# Patient Record
Sex: Male | Born: 1955 | Race: Asian | Hispanic: No | Marital: Married | Smoking: Never smoker
Health system: Southern US, Community
[De-identification: ages and names within clinical notes are randomized; demographics above are authoritative.]

## PROBLEM LIST (undated history)

## (undated) DIAGNOSIS — B1920 Unspecified viral hepatitis C without hepatic coma: Secondary | ICD-10-CM

## (undated) DIAGNOSIS — I10 Essential (primary) hypertension: Secondary | ICD-10-CM

## (undated) DIAGNOSIS — E785 Hyperlipidemia, unspecified: Secondary | ICD-10-CM

## (undated) DIAGNOSIS — E119 Type 2 diabetes mellitus without complications: Secondary | ICD-10-CM

## (undated) HISTORY — PX: COLONOSCOPY WITH PROPOFOL: SHX5780

## (undated) HISTORY — PX: OTHER SURGICAL HISTORY: SHX169

---

## 2010-03-31 ENCOUNTER — Emergency Department: Payer: Self-pay | Admitting: Emergency Medicine

## 2010-11-24 ENCOUNTER — Emergency Department: Payer: Self-pay | Admitting: Emergency Medicine

## 2011-10-18 IMAGING — CR RIGHT HAND - COMPLETE 3+ VIEW
1 series · 3 of 3 positions shown · non-contrast
Comparison: none

REASON FOR EXAM: pain after injury  -  ed waiting room
COMMENTS:   May transport without cardiac monitor

PROCEDURE:     DXR - DXR HAND RT COMPLETE W/OBLIQUES  - March 31, 2010 [DATE]
RESULT:     Images of the right hand demonstrate radiocarpal degenerative
change. There is no evidence of fracture, dislocation or radiopaque foreign
body.

[Series 1: view not recorded · 0.17mm/px · 3 of 3 slices shown]
[im 1/3]
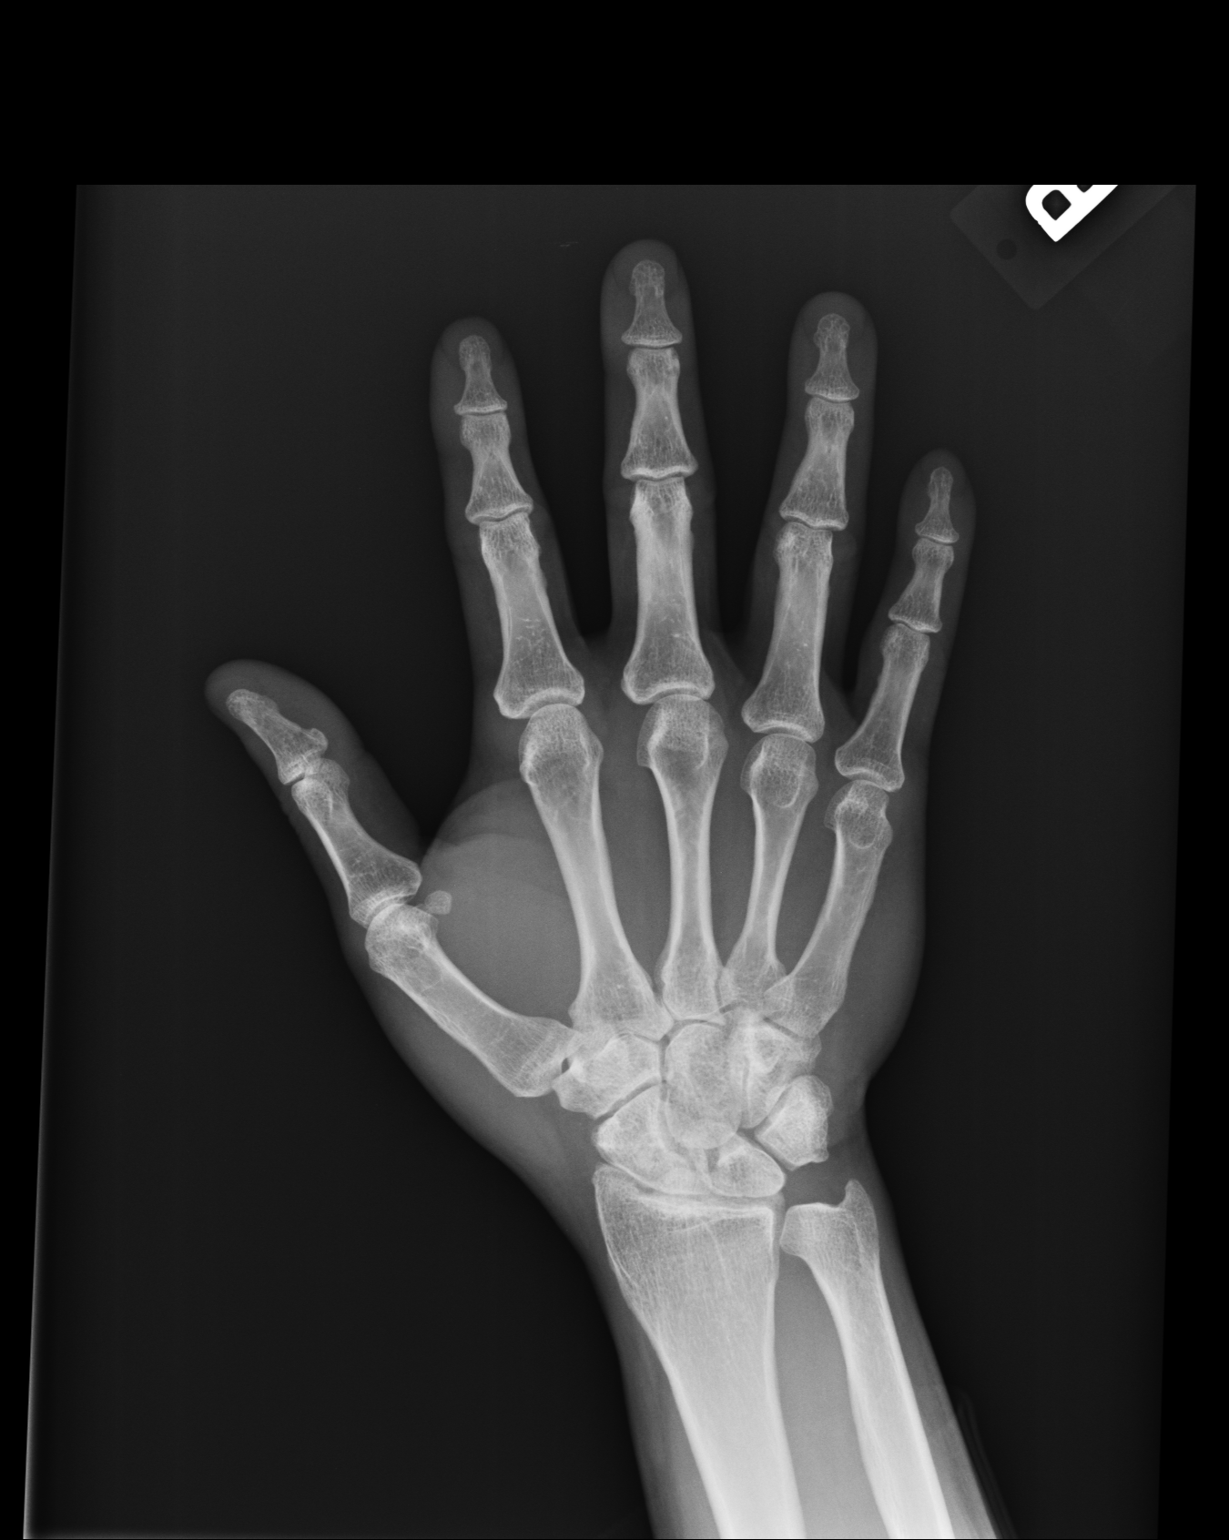
[im 2/3]
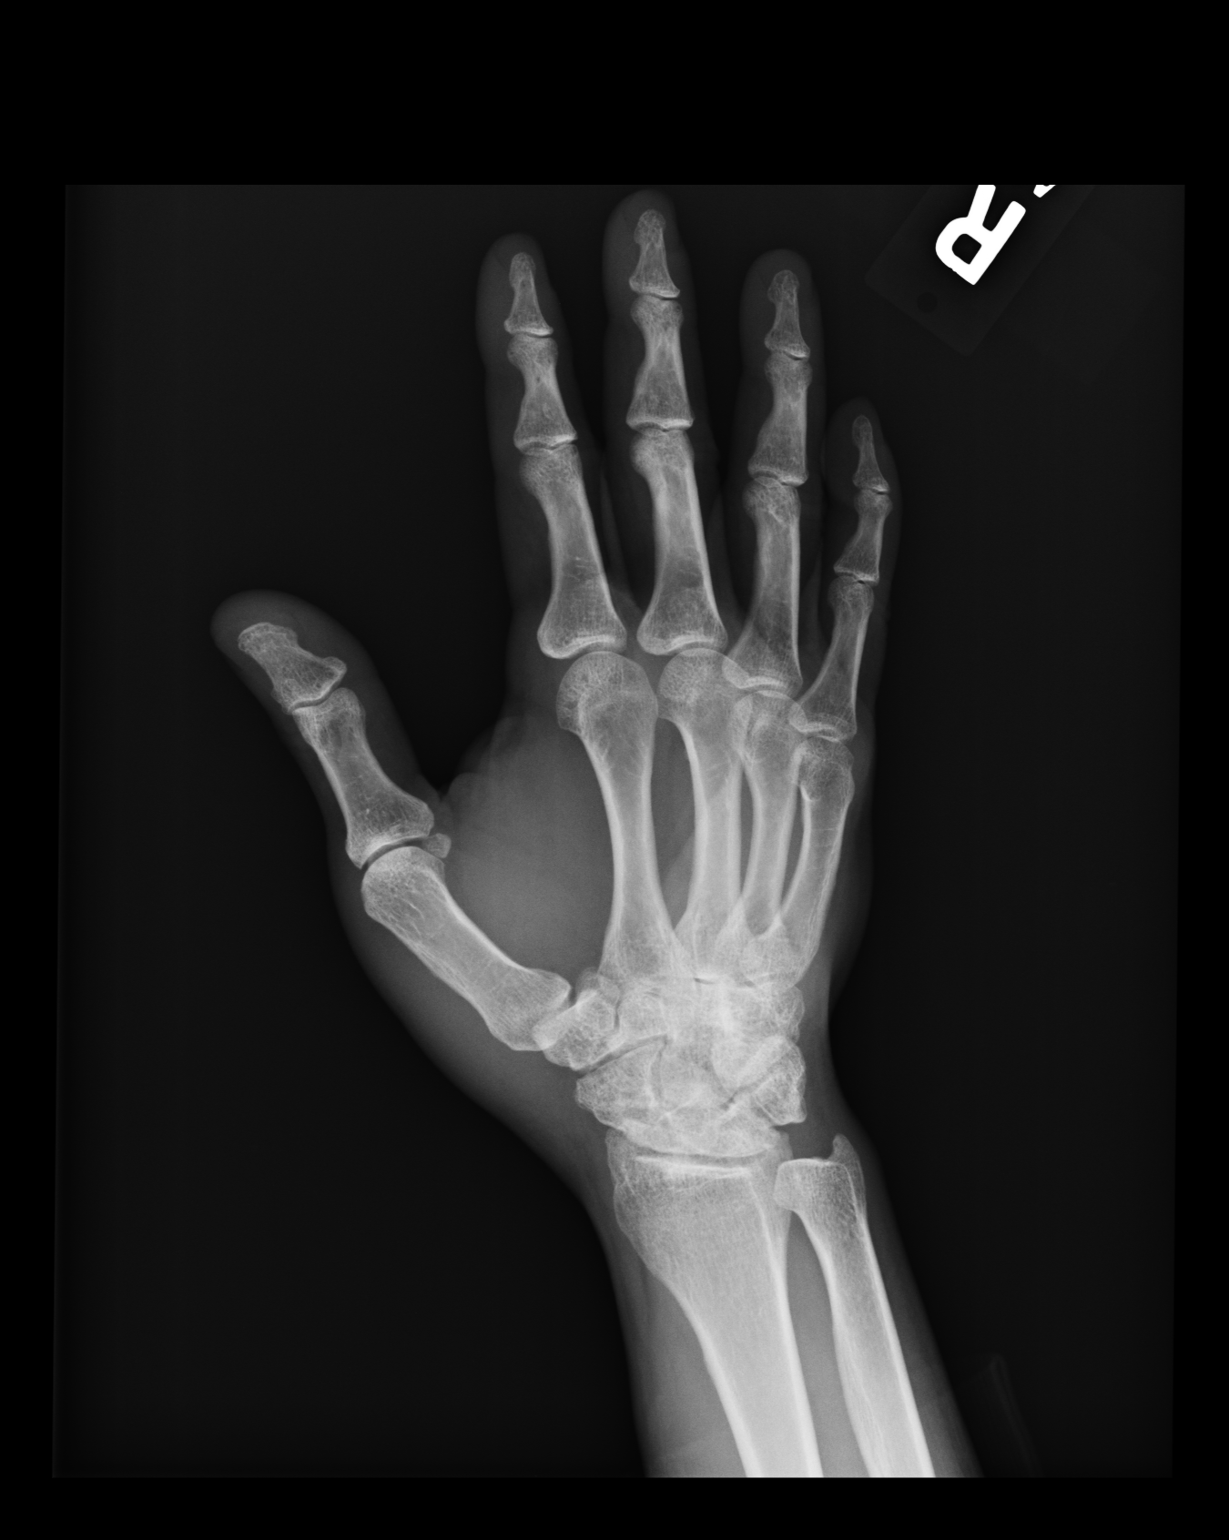
[im 3/3]
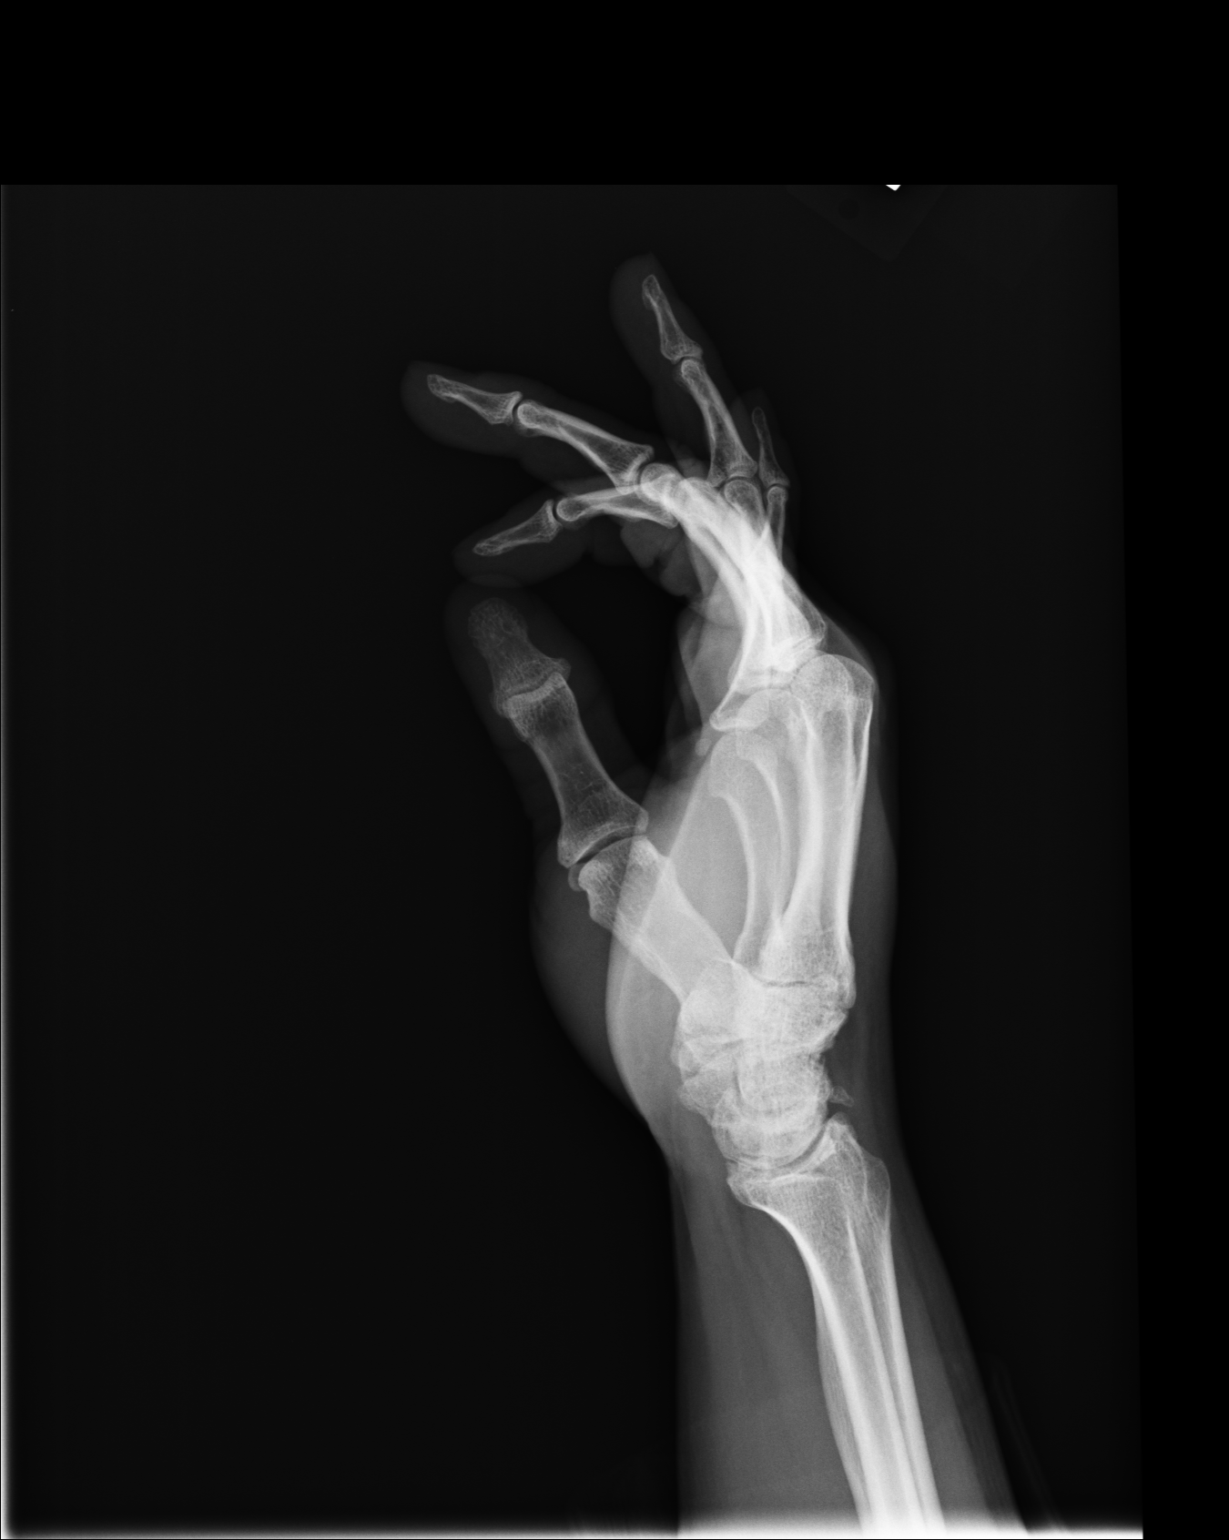

[3 of 3 positions shown; findings below may reference images not displayed]

IMPRESSION: No acute bony abnormality.

## 2021-04-16 ENCOUNTER — Encounter: Payer: Self-pay | Admitting: Emergency Medicine

## 2021-04-16 ENCOUNTER — Emergency Department: Payer: Medicare Other

## 2021-04-16 DIAGNOSIS — Z5321 Procedure and treatment not carried out due to patient leaving prior to being seen by health care provider: Secondary | ICD-10-CM | POA: Diagnosis not present

## 2021-04-16 DIAGNOSIS — R079 Chest pain, unspecified: Secondary | ICD-10-CM | POA: Insufficient documentation

## 2021-04-16 LAB — CBC
HCT: 46.6 % (ref 39.0–52.0)
Hemoglobin: 16.3 g/dL (ref 13.0–17.0)
MCH: 31.7 pg (ref 26.0–34.0)
MCHC: 35 g/dL (ref 30.0–36.0)
MCV: 90.5 fL (ref 80.0–100.0)
Platelets: 197 10*3/uL (ref 150–400)
RBC: 5.15 MIL/uL (ref 4.22–5.81)
RDW: 13 % (ref 11.5–15.5)
WBC: 5.5 10*3/uL (ref 4.0–10.5)
nRBC: 0 % (ref 0.0–0.2)

## 2021-04-16 LAB — BASIC METABOLIC PANEL
Anion gap: 7 (ref 5–15)
BUN: 21 mg/dL (ref 8–23)
CO2: 25 mmol/L (ref 22–32)
Calcium: 9 mg/dL (ref 8.9–10.3)
Chloride: 104 mmol/L (ref 98–111)
Creatinine, Ser: 1.02 mg/dL (ref 0.61–1.24)
GFR, Estimated: >60 mL/min (ref >60)
Glucose, Bld: 142 mg/dL — ABNORMAL HIGH (ref 70–99)
Potassium: 3.7 mmol/L (ref 3.5–5.1)
Sodium: 136 mmol/L (ref 135–145)

## 2021-04-16 LAB — TROPONIN I (HIGH SENSITIVITY): Troponin I (High Sensitivity): 9 ng/L (ref <18)

## 2021-04-16 NOTE — ED Triage Notes (Signed)
Pt in with sharp L cp, onset 2 days ago. Pt states he felt the pain suddenly while sitting in his office chair at work, intermittent ever since. Also reporting higher than normal BP's (200's/100's PTA). Takes HTN meds, and some have been changed recently in past 10 days. Denies any sob, having some nausea. EKG reading afib in triage (HR 60-70's), endorses some dizziness

## 2021-04-17 ENCOUNTER — Emergency Department
Admission: EM | Admit: 2021-04-17 | Discharge: 2021-04-17 | Disposition: A | Discharge status: Home / Self Care | Payer: Medicare Other | Attending: Emergency Medicine | Admitting: Emergency Medicine

## 2021-04-17 DIAGNOSIS — R079 Chest pain, unspecified: Secondary | ICD-10-CM | POA: Diagnosis not present

## 2021-04-17 HISTORY — DX: Type 2 diabetes mellitus without complications: E11.9

## 2021-04-17 HISTORY — DX: Unspecified viral hepatitis C without hepatic coma: B19.20

## 2021-04-17 HISTORY — DX: Hyperlipidemia, unspecified: E78.5

## 2021-04-17 HISTORY — DX: Essential (primary) hypertension: I10

## 2021-04-17 LAB — TROPONIN I (HIGH SENSITIVITY): Troponin I (High Sensitivity): 8 ng/L (ref <18)

## 2021-04-17 NOTE — ED Notes (Signed)
No answer when called several times from lobby 

## 2022-04-23 NOTE — Anesthesia Preprocedure Evaluation (Signed)
Anesthesia Evaluation  Patient identified by MRN, date of birth, ID band Patient awake    Reviewed: Allergy & Precautions, NPO status , Patient's Chart, lab work & pertinent test results  Airway Mallampati: III  TM Distance: >3 FB Neck ROM: full    Dental  (+) Chipped   Pulmonary neg pulmonary ROS   Pulmonary exam normal        Cardiovascular Exercise Tolerance: Good hypertension, + dysrhythmias Atrial Fibrillation   Atrial fibrillation, S/P ablation with afib noted in clinic   Neuro/Psych negative neurological ROS  negative psych ROS   GI/Hepatic negative GI ROS, Neg liver ROS,,,  Endo/Other  diabetes    Renal/GU negative Renal ROS  negative genitourinary   Musculoskeletal   Abdominal   Peds  Hematology negative hematology ROS (+)   Anesthesia Other Findings Past Medical History: No date: Benign essential HTN No date: Diabetes mellitus without complication (HCC) No date: Hepatitis C No date: Hyperlipemia  No past surgical history on file.     Reproductive/Obstetrics negative OB ROS                             Anesthesia Physical Anesthesia Plan  ASA: 3  Anesthesia Plan: General   Post-op Pain Management:    Induction:   PONV Risk Score and Plan: Propofol infusion and TIVA  Airway Management Planned:   Additional Equipment:   Intra-op Plan:   Post-operative Plan:   Informed Consent:      Dental Advisory Given  Plan Discussed with: Anesthesiologist, CRNA and Surgeon  Anesthesia Plan Comments:         Anesthesia Quick Evaluation

## 2022-04-26 ENCOUNTER — Ambulatory Visit: Payer: Medicare Other | Admitting: Anesthesiology

## 2022-04-26 ENCOUNTER — Encounter
Admission: RE | Disposition: A | Discharge status: Home / Self Care | Payer: Self-pay | Source: Ambulatory Visit | Attending: Internal Medicine

## 2022-04-26 ENCOUNTER — Ambulatory Visit
Admission: RE | Admit: 2022-04-26 | Discharge: 2022-04-26 | Disposition: A | Discharge status: Home / Self Care | Payer: Medicare Other | Source: Ambulatory Visit | Attending: Internal Medicine | Admitting: Internal Medicine

## 2022-04-26 ENCOUNTER — Encounter: Payer: Self-pay | Admitting: Internal Medicine

## 2022-04-26 DIAGNOSIS — I4819 Other persistent atrial fibrillation: Secondary | ICD-10-CM

## 2022-04-26 DIAGNOSIS — E785 Hyperlipidemia, unspecified: Secondary | ICD-10-CM | POA: Diagnosis not present

## 2022-04-26 DIAGNOSIS — B1920 Unspecified viral hepatitis C without hepatic coma: Secondary | ICD-10-CM | POA: Diagnosis not present

## 2022-04-26 DIAGNOSIS — I10 Essential (primary) hypertension: Secondary | ICD-10-CM | POA: Insufficient documentation

## 2022-04-26 DIAGNOSIS — E119 Type 2 diabetes mellitus without complications: Secondary | ICD-10-CM | POA: Diagnosis not present

## 2022-04-26 DIAGNOSIS — I4891 Unspecified atrial fibrillation: Secondary | ICD-10-CM | POA: Insufficient documentation

## 2022-04-26 DIAGNOSIS — I252 Old myocardial infarction: Secondary | ICD-10-CM | POA: Diagnosis not present

## 2022-04-26 HISTORY — PX: CARDIOVERSION: SHX1299

## 2022-04-26 LAB — GLUCOSE, CAPILLARY: Glucose-Capillary: 140 mg/dL — ABNORMAL HIGH (ref 70–99)

## 2022-04-26 SURGERY — CARDIOVERSION
Anesthesia: General

## 2022-04-26 MED ORDER — PROPOFOL 10 MG/ML IV BOLUS
INTRAVENOUS | Status: DC | PRN
  Administered 2022-04-26: 70 mg via INTRAVENOUS

## 2022-04-26 MED ORDER — SODIUM CHLORIDE 0.9 % IV SOLN: INTRAVENOUS | Status: DC

## 2022-04-26 NOTE — CV Procedure (Addendum)
Electrical Cardioversion Procedure Note   Procedure: Electrical Cardioversion Indications:  Atrial Fibrillation  Procedure Details Consent: Risks of procedure as well as the alternatives and risks of each were explained to the (patient/caregiver).  Consent for procedure obtained. Time Out: Verified patient identification, verified procedure, site/side was marked, verified correct patient position, special equipment/implants available, medications/allergies/relevent history reviewed, required imaging and test results available.  Performed  Patient placed on cardiac monitor, pulse oximetry, supplemental oxygen as necessary.  Sedation given:    Yes , anesthesia Pacer pads placed anterior and posterior chest.  Cardioverted 1 time(s).  Cardioverted at 120J.  Evaluation Findings: Post procedure EKG shows: NSR 70 nsstw Complications: None Patient did tolerate procedure well.   Dorothyann Peng MD 04/26/22 7:45am

## 2022-04-26 NOTE — Transfer of Care (Signed)
Immediate Anesthesia Transfer of Care Note  Patient: Troy Chase  Procedure(s) Performed: CARDIOVERSION  Patient Location: Short Stay  Anesthesia Type:General  Level of Consciousness: drowsy  Airway & Oxygen Therapy: Patient Spontanous Breathing and Patient connected to nasal cannula oxygen  Post-op Assessment: Report given to RN, Post -op Vital signs reviewed and stable, and Patient moving all extremities  Post vital signs: Reviewed and stable  Last Vitals:  Vitals Value Taken Time  BP 122/92 04/26/22 0742  Temp    Pulse 61 04/26/22 0745  Resp 26 04/26/22 0745  SpO2 92 % 04/26/22 0745  Vitals shown include unvalidated device data.  Last Pain:  Vitals:   04/26/22 0715  TempSrc: Oral  PainSc: 0-No pain         Complications: No notable events documented.

## 2022-04-26 NOTE — Anesthesia Postprocedure Evaluation (Signed)
Anesthesia Post Note  Patient: Troy Chase  Procedure(s) Performed: CARDIOVERSION  Patient location during evaluation: PACU Anesthesia Type: General Level of consciousness: awake and alert Pain management: pain level controlled Vital Signs Assessment: post-procedure vital signs reviewed and stable Respiratory status: spontaneous breathing, nonlabored ventilation and respiratory function stable Cardiovascular status: blood pressure returned to baseline and stable Postop Assessment: no apparent nausea or vomiting Anesthetic complications: no   No notable events documented.   Last Vitals:  Vitals:   04/26/22 0750 04/26/22 0800  BP: 127/75 (!) 124/91  Pulse: 64 67  Resp: 19 17  Temp:    SpO2: 97% 96%    Last Pain:  Vitals:   04/26/22 0800  TempSrc:   PainSc: 0-No pain                 Foye Deer

## 2022-09-02 ENCOUNTER — Encounter: Payer: Self-pay | Admitting: *Deleted

## 2022-09-03 ENCOUNTER — Ambulatory Visit
Admission: RE | Admit: 2022-09-03 | Discharge: 2022-09-03 | Disposition: A | Discharge status: Home / Self Care | Payer: Medicare Other | Attending: Gastroenterology | Admitting: Gastroenterology

## 2022-09-03 ENCOUNTER — Encounter
Admission: RE | Disposition: A | Discharge status: Home / Self Care | Payer: Self-pay | Source: Home / Self Care | Attending: Gastroenterology

## 2022-09-03 ENCOUNTER — Ambulatory Visit: Payer: Medicare Other | Admitting: Anesthesiology

## 2022-09-03 ENCOUNTER — Encounter: Payer: Self-pay | Admitting: *Deleted

## 2022-09-03 DIAGNOSIS — K640 First degree hemorrhoids: Secondary | ICD-10-CM | POA: Insufficient documentation

## 2022-09-03 DIAGNOSIS — I4891 Unspecified atrial fibrillation: Secondary | ICD-10-CM | POA: Insufficient documentation

## 2022-09-03 DIAGNOSIS — Z7984 Long term (current) use of oral hypoglycemic drugs: Secondary | ICD-10-CM | POA: Insufficient documentation

## 2022-09-03 DIAGNOSIS — I440 Atrioventricular block, first degree: Secondary | ICD-10-CM | POA: Diagnosis not present

## 2022-09-03 DIAGNOSIS — Z8619 Personal history of other infectious and parasitic diseases: Secondary | ICD-10-CM | POA: Insufficient documentation

## 2022-09-03 DIAGNOSIS — E119 Type 2 diabetes mellitus without complications: Secondary | ICD-10-CM | POA: Diagnosis not present

## 2022-09-03 DIAGNOSIS — D123 Benign neoplasm of transverse colon: Secondary | ICD-10-CM | POA: Diagnosis not present

## 2022-09-03 DIAGNOSIS — Z7901 Long term (current) use of anticoagulants: Secondary | ICD-10-CM | POA: Diagnosis not present

## 2022-09-03 DIAGNOSIS — Z1211 Encounter for screening for malignant neoplasm of colon: Secondary | ICD-10-CM | POA: Insufficient documentation

## 2022-09-03 DIAGNOSIS — Z86010 Personal history of colonic polyps: Secondary | ICD-10-CM | POA: Diagnosis present

## 2022-09-03 DIAGNOSIS — I10 Essential (primary) hypertension: Secondary | ICD-10-CM | POA: Diagnosis not present

## 2022-09-03 HISTORY — PX: COLONOSCOPY WITH PROPOFOL: SHX5780

## 2022-09-03 LAB — GLUCOSE, CAPILLARY: Glucose-Capillary: 116 mg/dL — ABNORMAL HIGH (ref 70–99)

## 2022-09-03 SURGERY — COLONOSCOPY WITH PROPOFOL
Anesthesia: General

## 2022-09-03 MED ORDER — SODIUM CHLORIDE 0.9 % IV SOLN: INTRAVENOUS | Status: DC

## 2022-09-03 MED ORDER — LIDOCAINE HCL (CARDIAC) PF 100 MG/5ML IV SOSY
PREFILLED_SYRINGE | INTRAVENOUS | Status: DC | PRN
  Administered 2022-09-03: 50 mg via INTRAVENOUS

## 2022-09-03 MED ORDER — LIDOCAINE HCL (PF) 2 % IJ SOLN
INTRAMUSCULAR | Status: AC
  Filled 2022-09-03: qty 5

## 2022-09-03 MED ORDER — PROPOFOL 1000 MG/100ML IV EMUL
INTRAVENOUS | Status: AC
  Filled 2022-09-03: qty 100

## 2022-09-03 MED ORDER — PROPOFOL 10 MG/ML IV BOLUS
INTRAVENOUS | Status: DC | PRN
  Administered 2022-09-03 (×4): 20 mg via INTRAVENOUS
  Administered 2022-09-03: 30 mg via INTRAVENOUS
  Administered 2022-09-03: 70 mg via INTRAVENOUS
  Administered 2022-09-03: 30 mg via INTRAVENOUS
  Administered 2022-09-03: 20 mg via INTRAVENOUS

## 2022-09-03 MED ORDER — STERILE WATER FOR IRRIGATION IR SOLN
Status: DC | PRN
  Administered 2022-09-03: 120 mL

## 2022-09-03 NOTE — Op Note (Signed)
[facility] Gastroenterology Patient Name: Troy Chase Procedure Date: 09/03/2022 11:24 AM MRN: [id number] Account #: [id number] Date of Birth: 1955-09-15 Admit Type: Outpatient Age: 67 Room: [facility] ENDO ROOM 3 Gender: Male Note Status: Finalized Instrument Name: Peds Colonoscope [id number] Procedure:             Colonoscopy Indications:           Surveillance: Personal history of adenomatous polyps                         on last colonoscopy > 5 years ago Providers:             Eather Colas MD, MD Medicines:             Monitored Anesthesia Care Complications:         No immediate complications. Estimated blood loss:                         Minimal. Procedure:             Pre-Anesthesia Assessment:                        - Prior to the procedure, a History and Physical was                         performed, and patient medications and allergies were                         reviewed. The patient is competent. The risks and                         benefits of the procedure and the sedation options and                         risks were discussed with the patient. All questions                         were answered and informed consent was obtained.                         Patient identification and proposed procedure were                         verified by the physician, the nurse, the                         anesthesiologist, the anesthetist and the technician                         in the endoscopy suite. Mental Status Examination:                         alert and oriented. Airway Examination: normal                         oropharyngeal airway and neck mobility. Respiratory                         Examination: clear to auscultation. CV Examination:  normal. Prophylactic Antibiotics: The patient does not                         require prophylactic antibiotics. Prior                         Anticoagulants: The patient has taken Eliquis                          (apixaban), last dose was 3 days prior to procedure.                         ASA Grade Assessment: III - A patient with severe                         systemic disease. After reviewing the risks and                         benefits, the patient was deemed in satisfactory                         condition to undergo the procedure. The anesthesia                         plan was to use monitored anesthesia care (MAC).                         Immediately prior to administration of medications,                         the patient was re-assessed for adequacy to receive                         sedatives. The heart rate, respiratory rate, oxygen                         saturations, blood pressure, adequacy of pulmonary                         ventilation, and response to care were monitored                         throughout the procedure. The physical status of the                         patient was re-assessed after the procedure.                        After obtaining informed consent, the colonoscope was                         passed under direct vision. Throughout the procedure,                         the patient's blood pressure, pulse, and oxygen                         saturations were monitored continuously. The  Colonoscope was introduced through the anus and                         advanced to the the cecum, identified by appendiceal                         orifice and ileocecal valve. The colonoscopy was                         somewhat difficult due to a tortuous colon. The                         patient tolerated the procedure well. The quality of                         the bowel preparation was adequate to identify polyps.                         The ileocecal valve, appendiceal orifice, and rectum                         were photographed. Findings:      The perianal and digital rectal examinations were normal.      A 1 mm  polyp was found in the transverse colon. The polyp was       hyperplastic. The polyp was removed with a jumbo cold forceps. Resection       and retrieval were complete. Estimated blood loss was minimal.      Internal hemorrhoids were found during retroflexion. The hemorrhoids       were Grade I (internal hemorrhoids that do not prolapse).      The exam was otherwise without abnormality on direct and retroflexion       views. Impression:            - One 1 mm polyp in the transverse colon, removed with                         a jumbo cold forceps. Resected and retrieved.                        - Internal hemorrhoids.                        - The examination was otherwise normal on direct and                         retroflexion views. Recommendation:        - Discharge patient to home.                        - Resume previous diet.                        - Resume Eliquis (apixaban) at prior dose today.                        - Await pathology results.                        - Repeat colonoscopy  in 5 years for surveillance.                        - Return to referring physician as previously                         scheduled. Procedure Code(s):     --- Professional ---                        [phone], Colonoscopy, flexible; with biopsy, single or                         multiple Diagnosis Code(s):     --- Professional ---                        Z86.010, Personal history of colonic polyps                        D12.3, Benign neoplasm of transverse colon (hepatic                         flexure or splenic flexure)                        K64.0, First degree hemorrhoids CPT copyright 2022 [employer]. All rights reserved. The codes documented in this report are preliminary and upon coder review may  be revised to meet current compliance requirements. Eather Colas MD, MD 09/03/2022 12:02:17 PM Number of Addenda: 0 Note Initiated On: 09/03/2022 11:24 AM Scope Withdrawal  Time: 0 hours 12 minutes 26 seconds  Total Procedure Duration: 0 hours 18 minutes 38 seconds  Estimated Blood Loss:  Estimated blood loss was minimal.      [facility]

## 2022-09-03 NOTE — Anesthesia Postprocedure Evaluation (Signed)
Anesthesia Post Note  Patient: Troy Chase  Procedure(s) Performed: COLONOSCOPY WITH PROPOFOL  Patient location during evaluation: PACU Anesthesia Type: General Level of consciousness: awake and alert, oriented and patient cooperative Pain management: pain level controlled Vital Signs Assessment: post-procedure vital signs reviewed and stable Respiratory status: spontaneous breathing, nonlabored ventilation and respiratory function stable Cardiovascular status: blood pressure returned to baseline and stable Postop Assessment: adequate PO intake Anesthetic complications: no   No notable events documented.   Last Vitals:  Vitals:   09/03/22 1211 09/03/22 1221  BP: 130/82 129/77  Pulse:    Resp:    Temp:    SpO2:      Last Pain:  Vitals:   09/03/22 1221  TempSrc:   PainSc: 0-No pain                 Reed Breech

## 2022-09-03 NOTE — H&P (Signed)
Outpatient short stay form Pre-procedure 09/03/2022  Regis Bill, MD  Primary Physician: Danella Penton, MD  Reason for visit:  Surveillance  History of present illness:    67 y/o gentleman with PMH of a. Fib on DOAC (last dose 3 days ago) and hypertension here for surveillance colonoscopy. Last colonoscopy in 2017 with benign polyps. No family history of colon or stomach cancer. No abdominal surgeries.    Current Facility-Administered Medications:    0.9 %  sodium chloride infusion, , Intravenous, Continuous, Nuala Chiles, Rossie Muskrat, MD, Last Rate: 20 mL/hr at 09/03/22 1129, New Bag at 09/03/22 1129  Medications Prior to Admission  Medication Sig Dispense Refill Last Dose   amiodarone (PACERONE) 200 MG tablet Take 200 mg by mouth daily.   08/31/2022   amLODipine (NORVASC) 10 MG tablet Take 10 mg by mouth daily.   08/31/2022   apixaban (ELIQUIS) 5 MG TABS tablet Take 5 mg by mouth 2 (two) times daily.   08/31/2022   atorvastatin (LIPITOR) 10 MG tablet Take 10 mg by mouth daily.      clonazePAM (KLONOPIN) 0.5 MG tablet Take 0.5 mg by mouth 2 (two) times daily as needed for anxiety.   08/31/2022   metformin (FORTAMET) 500 MG (OSM) 24 hr tablet Take 500 mg by mouth daily with breakfast.   08/31/2022   telmisartan (MICARDIS) 80 MG tablet Take 80 mg by mouth daily.   08/31/2022     No Known Allergies   Past Medical History:  Diagnosis Date   Benign essential HTN    Diabetes mellitus without complication    Hepatitis C    Hyperlipemia     Review of systems:  Otherwise negative.    Physical Exam  Gen: Alert, oriented. Appears stated age.  HEENT: PERRLA. Lungs: No respiratory distress CV: RRR Abd: soft, benign, no masses Ext: No edema    Planned procedures: Proceed with colonoscopy. The patient understands the nature of the planned procedure, indications, risks, alternatives and potential complications including but not limited to bleeding, infection, perforation, damage to  internal organs and possible oversedation/side effects from anesthesia. The patient agrees and gives consent to proceed.  Please refer to procedure notes for findings, recommendations and patient disposition/instructions.     Regis Bill, MD [facility] Gastroenterology

## 2022-09-03 NOTE — Interval H&P Note (Signed)
History and Physical Interval Note:  09/03/2022 11:35 AM  Troy Chase  has presented today for surgery, with the diagnosis of PH Colon Polyps.  The various methods of treatment have been discussed with the patient and family. After consideration of risks, benefits and other options for treatment, the patient has consented to  Procedure(s): COLONOSCOPY WITH PROPOFOL (N/A) as a surgical intervention.  The patient's history has been reviewed, patient examined, no change in status, stable for surgery.  I have reviewed the patient's chart and labs.  Questions were answered to the patient's satisfaction.     Regis Bill  Ok to proceed with colonoscopy

## 2022-09-03 NOTE — Anesthesia Preprocedure Evaluation (Addendum)
Anesthesia Evaluation  Patient identified by MRN, date of birth, ID band Patient awake    Reviewed: Allergy & Precautions, NPO status , Patient's Chart, lab work & pertinent test results  History of Anesthesia Complications Negative for: history of anesthetic complications  Airway Mallampati: IV   Neck ROM: Full    Dental no notable dental hx.    Pulmonary neg pulmonary ROS   Pulmonary exam normal breath sounds clear to auscultation       Cardiovascular hypertension, Normal cardiovascular exam+ dysrhythmias (a fib on Eliquis)  Rhythm:Regular Rate:Normal  ECG 06/22/22: Sinus rhythm 1st degree AV block  Nonspecific T wave abnormalities  Abnormal ECG  When compared with ECG of 20-Apr-2022 12:14,  Sinus rhythm has replaced Atrial fibrillation  Non-specific change in ST segment in Anterior leads  T wave amplitude has decreased in Anterior leads    Neuro/Psych negative neurological ROS     GI/Hepatic negative GI ROS,,,(+) Hepatitis -, C  Endo/Other  diabetes, Type 2    Renal/GU negative Renal ROS     Musculoskeletal   Abdominal   Peds  Hematology negative hematology ROS (+)   Anesthesia Other Findings   Reproductive/Obstetrics                             Anesthesia Physical Anesthesia Plan  ASA: 3  Anesthesia Plan: General   Post-op Pain Management:    Induction: Intravenous  PONV Risk Score and Plan: 2 and Propofol infusion, TIVA and Treatment may vary due to age or medical condition  Airway Management Planned: Natural Airway  Additional Equipment:   Intra-op Plan:   Post-operative Plan:   Informed Consent: I have reviewed the patients History and Physical, chart, labs and discussed the procedure including the risks, benefits and alternatives for the proposed anesthesia with the patient or authorized representative who has indicated his/her understanding and acceptance.        Plan Discussed with: CRNA  Anesthesia Plan Comments: (LMA/GETA backup discussed.  Patient consented for risks of anesthesia including but not limited to:  - adverse reactions to medications - damage to eyes, teeth, lips or other oral mucosa - nerve damage due to positioning  - sore throat or hoarseness - damage to heart, brain, nerves, lungs, other parts of body or loss of life  Informed patient about role of CRNA in peri- and intra-operative care.  Patient voiced understanding.)        Anesthesia Quick Evaluation

## 2022-09-03 NOTE — Transfer of Care (Signed)
Immediate Anesthesia Transfer of Care Note  Patient: Troy Chase  Procedure(s) Performed: COLONOSCOPY WITH PROPOFOL  Patient Location: PACU and Endoscopy Unit  Anesthesia Type:MAC  Level of Consciousness: drowsy  Airway & Oxygen Therapy: Patient Spontanous Breathing and Patient connected to nasal cannula oxygen  Post-op Assessment: Report given to RN and Post -op Vital signs reviewed and stable  Post vital signs: Reviewed and stable  Last Vitals:  Vitals Value Taken Time  BP 120/91 09/03/22 1201  Temp    Pulse 65 09/03/22 1201  Resp    SpO2 99 % 09/03/22 1201  Vitals shown include unvalidated device data.  Last Pain:  Vitals:   09/03/22 1126  TempSrc: Temporal  PainSc: 0-No pain         Complications: No notable events documented.

## 2022-09-06 ENCOUNTER — Encounter: Payer: Self-pay | Admitting: Gastroenterology

## 2022-09-06 LAB — SURGICAL PATHOLOGY

## 2023-09-15 DIAGNOSIS — E1121 Type 2 diabetes mellitus with diabetic nephropathy: Secondary | ICD-10-CM | POA: Diagnosis not present

## 2023-09-22 DIAGNOSIS — E1121 Type 2 diabetes mellitus with diabetic nephropathy: Secondary | ICD-10-CM | POA: Diagnosis not present

## 2023-09-22 DIAGNOSIS — Z125 Encounter for screening for malignant neoplasm of prostate: Secondary | ICD-10-CM | POA: Diagnosis not present

## 2023-09-22 DIAGNOSIS — I4819 Other persistent atrial fibrillation: Secondary | ICD-10-CM | POA: Diagnosis not present

## 2024-02-01 DIAGNOSIS — Z9289 Personal history of other medical treatment: Secondary | ICD-10-CM | POA: Diagnosis not present

## 2024-02-01 DIAGNOSIS — E782 Mixed hyperlipidemia: Secondary | ICD-10-CM | POA: Diagnosis not present

## 2024-02-01 DIAGNOSIS — E1121 Type 2 diabetes mellitus with diabetic nephropathy: Secondary | ICD-10-CM | POA: Diagnosis not present

## 2024-02-01 DIAGNOSIS — I10 Essential (primary) hypertension: Secondary | ICD-10-CM | POA: Diagnosis not present

## 2024-02-01 DIAGNOSIS — I480 Paroxysmal atrial fibrillation: Secondary | ICD-10-CM | POA: Diagnosis not present

## 2024-02-01 DIAGNOSIS — I4819 Other persistent atrial fibrillation: Secondary | ICD-10-CM | POA: Diagnosis not present

## 2024-02-01 DIAGNOSIS — Z9889 Other specified postprocedural states: Secondary | ICD-10-CM | POA: Diagnosis not present

## 2024-02-01 DIAGNOSIS — Z8619 Personal history of other infectious and parasitic diseases: Secondary | ICD-10-CM | POA: Diagnosis not present

## 2024-02-01 DIAGNOSIS — Z8679 Personal history of other diseases of the circulatory system: Secondary | ICD-10-CM | POA: Diagnosis not present

## 2024-02-16 DIAGNOSIS — S52592A Other fractures of lower end of left radius, initial encounter for closed fracture: Secondary | ICD-10-CM | POA: Diagnosis not present

## 2024-02-16 DIAGNOSIS — S6992XA Unspecified injury of left wrist, hand and finger(s), initial encounter: Secondary | ICD-10-CM | POA: Diagnosis not present

## 2024-03-28 DIAGNOSIS — Z125 Encounter for screening for malignant neoplasm of prostate: Secondary | ICD-10-CM | POA: Diagnosis not present

## 2024-03-28 DIAGNOSIS — E1121 Type 2 diabetes mellitus with diabetic nephropathy: Secondary | ICD-10-CM | POA: Diagnosis not present

## 2024-04-04 DIAGNOSIS — Z0000 Encounter for general adult medical examination without abnormal findings: Secondary | ICD-10-CM | POA: Diagnosis not present

## 2024-04-04 DIAGNOSIS — Z23 Encounter for immunization: Secondary | ICD-10-CM | POA: Diagnosis not present

## 2024-04-04 DIAGNOSIS — E1129 Type 2 diabetes mellitus with other diabetic kidney complication: Secondary | ICD-10-CM | POA: Diagnosis not present

## 2024-04-04 DIAGNOSIS — E782 Mixed hyperlipidemia: Secondary | ICD-10-CM | POA: Diagnosis not present
